# Patient Record
Sex: Female | Born: 1995 | Race: Black or African American | Hispanic: No | Marital: Single | Smoking: Never smoker
Health system: Southern US, Community
[De-identification: ages and names within clinical notes are randomized; demographics above are authoritative.]

## PROBLEM LIST (undated history)

## (undated) DIAGNOSIS — R569 Unspecified convulsions: Secondary | ICD-10-CM

## (undated) HISTORY — PX: ADENOIDECTOMY AND MYRINGOTOMY WITH TUBE PLACEMENT: SHX5714

## (undated) HISTORY — DX: Unspecified convulsions: R56.9

## (undated) HISTORY — PX: TYMPANOSTOMY TUBE PLACEMENT: SHX32

---
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
## 2017-04-19 ENCOUNTER — Ambulatory Visit: Payer: Self-pay | Admitting: Family Medicine

## 2017-04-21 ENCOUNTER — Ambulatory Visit: Payer: BLUE CROSS/BLUE SHIELD | Admitting: Family Medicine

## 2017-04-21 ENCOUNTER — Encounter: Payer: Self-pay | Admitting: Family Medicine

## 2017-04-21 VITALS — BP 100/60 | HR 87 | Temp 97.1°F | Ht 64.75 in | Wt 115.1 lb

## 2017-04-21 DIAGNOSIS — L2082 Flexural eczema: Secondary | ICD-10-CM

## 2017-04-21 DIAGNOSIS — Z8669 Personal history of other diseases of the nervous system and sense organs: Secondary | ICD-10-CM

## 2017-04-21 DIAGNOSIS — Z0000 Encounter for general adult medical examination without abnormal findings: Secondary | ICD-10-CM | POA: Diagnosis not present

## 2017-04-21 NOTE — Patient Instructions (Addendum)
Preventive Care 18-39 Years, Female Preventive care refers to lifestyle choices and visits with your health care provider that can promote health and wellness. What does preventive care include?  A yearly physical exam. This is also called an annual well check.  Dental exams once or twice a year.  Routine eye exams. Ask your health care provider how often you should have your eyes checked.  Personal lifestyle choices, including: ? Daily care of your teeth and gums. ? Regular physical activity. ? Eating a healthy diet. ? Avoiding tobacco and drug use. ? Limiting alcohol use. ? Practicing safe sex. ? Taking vitamin and mineral supplements as recommended by your health care provider. What happens during an annual well check? The services and screenings done by your health care provider during your annual well check will depend on your age, overall health, lifestyle risk factors, and family history of disease. Counseling Your health care provider may ask you questions about your:  Alcohol use.  Tobacco use.  Drug use.  Emotional well-being.  Home and relationship well-being.  Sexual activity.  Eating habits.  Work and work Statistician.  Method of birth control.  Menstrual cycle.  Pregnancy history.  Screening You may have the following tests or measurements:  Height, weight, and BMI.  Diabetes screening. This is done by checking your blood sugar (glucose) after you have not eaten for a while (fasting).  Blood pressure.  Lipid and cholesterol levels. These may be checked every 5 years starting at age 20.  Skin check.  Hepatitis C blood test.  Hepatitis B blood test.  Sexually transmitted disease (STD) testing.  BRCA-related cancer screening. This may be done if you have a family history of breast, ovarian, tubal, or peritoneal cancers.  Pelvic exam and Pap test. This may be done every 3 years starting at age 20. Starting at age 20, this may be done every 5  years if you have a Pap test in combination with an HPV test.  Discuss your test results, treatment options, and if necessary, the need for more tests with your health care provider. Vaccines Your health care provider may recommend certain vaccines, such as:  Influenza vaccine. This is recommended every year.  Tetanus, diphtheria, and acellular pertussis (Tdap, Td) vaccine. You may need a Td booster every 10 years.  Varicella vaccine. You may need this if you have not been vaccinated.  HPV vaccine. If you are 20 or younger, you may need three doses over 6 months.  Measles, mumps, and rubella (MMR) vaccine. You may need at least one dose of MMR. You may also need a second dose.  Pneumococcal 13-valent conjugate (PCV13) vaccine. You may need this if you have certain conditions and were not previously vaccinated.  Pneumococcal polysaccharide (PPSV23) vaccine. You may need one or two doses if you smoke cigarettes or if you have certain conditions.  Meningococcal vaccine. One dose is recommended if you are age 20-21 years and a first-year college student living in a residence hall, or if you have one of several medical conditions. You may also need additional booster doses.  Hepatitis A vaccine. You may need this if you have certain conditions or if you travel or work in places where you may be exposed to hepatitis A.  Hepatitis B vaccine. You may need this if you have certain conditions or if you travel or work in places where you may be exposed to hepatitis B.  Haemophilus influenzae type b (Hib) vaccine. You may need this if  you have certain risk factors.  Talk to your health care provider about which screenings and vaccines you need and how often you need them. This information is not intended to replace advice given to you by your health care provider. Make sure you discuss any questions you have with your health care provider. Document Released: 04/21/2001 Document Revised: 11/13/2015  Document Reviewed: 12/25/2014 Elsevier Interactive Patient Education  2018 [employer].  Eczema Eczema is a broad term for a group of skin conditions that cause skin to become rough and inflamed. Each type of eczema has different triggers, symptoms, and treatments. Eczema of any type is usually itchy and symptoms range from mild to severe. Eczema and its symptoms are not spread from person to person (are not contagious). It can appear on different parts of the body at different times. Your eczema may not look the same as someone else's eczema. What are the types of eczema? Atopic dermatitis This is a long-term (chronic) skin disease that keeps coming back (recurring). Usual symptoms are dry skin and small, solid pimples that may swell and leak fluid (weep). Contact dermatitis This happens when something irritates the skin and causes a rash. The irritation can come from substances that you are allergic to (allergens), such as poison ivy, chemicals, or medicines that were applied to your skin. Dyshidrotic eczema This is a form of eczema on the hands and feet. It shows up as very itchy, fluid-filled blisters. It can affect people of any age, but is more common before age 36. Hand eczema This causes very itchy areas of skin on the palms and sides of the hands and fingers. This type of eczema is common in industrial jobs where you may be exposed to many different types of irritants. Lichen simplex chronicus This type of eczema occurs when a person constantly scratches one area of the body. Repeated scratching of the area leads to thickened skin (lichenification). Lichen simplex chronicus can occur along with other types of eczema. It is more common in adults, but may be seen in children as well. Nummular eczema This is a common type of eczema. It has no known cause. It typically causes a red, circular, crusty lesion (plaque) that may be itchy. Scratching may become a habit and can cause bleeding.  Nummular eczema occurs most often in people of middle-age or older. It most often affects the hands. Seborrheic dermatitis This is a common skin disease that mainly affects the scalp. It may also affect any oily areas of the body, such as the face, sides of nose, eyebrows, ears, eyelids, and chest. It is marked by small scaling and redness of the skin (erythema). This can affect people of all ages. In infants, this condition is known as Chartered certified accountant." Stasis dermatitis This is a common skin disease that usually appears on the legs and feet. It most often occurs in people who have a condition that prevents blood from being pumped through the veins in the legs (chronic venous insufficiency). Stasis dermatitis is a chronic condition that needs long-term management. How is eczema diagnosed? Your health care provider will examine your skin and review your medical history. He or she may also give you skin patch tests. These tests involve taking patches that contain possible allergens and placing them on your back. He or she will then check in a few days to see if an allergic reaction occurred. What are the common treatments? Treatment for eczema is based on the type of eczema you have. Hydrocortisone  steroid medicine can relieve itching quickly and help reduce inflammation. This medicine may be prescribed or obtained over-the-counter, depending on the strength of the medicine that is needed. Follow these instructions at home:  Take over-the-counter and prescription medicines only as told by your health care provider.  Use creams or ointments to moisturize your skin. Do not use lotions.  Learn what triggers or irritates your symptoms. Avoid these things.  Treat symptom flare-ups quickly.  Do not itch your skin. This can make your rash worse.  Keep all follow-up visits as told by your health care provider. This is important. Where to find more information:  The American Academy of Dermatology:  http://jones-macias.info/  The National Eczema Association: www.nationaleczema.org Contact a health care provider if:  You have serious itching, even with treatment.  You regularly scratch your skin until it bleeds.  Your rash looks different than usual.  Your skin is painful, swollen, or more red than usual.  You have a fever. Summary  There are eight general types of eczema. Each type has different triggers.  Eczema of any type causes itching that may range from mild to severe.  Treatment varies based on the type of eczema you have. Hydrocortisone steroid medicine can help with itching and inflammation.  Protecting your skin is the best way to prevent eczema. Use moisturizers and lotions. Avoid triggers and irritants, and treat flare-ups quickly. This information is not intended to replace advice given to you by your health care provider. Make sure you discuss any questions you have with your health care provider. Document Released: 07/09/2016 Document Revised: 07/09/2016 Document Reviewed: 07/09/2016 Elsevier Interactive Patient Education  2018 [employer].

## 2017-04-21 NOTE — Progress Notes (Signed)
Patient presents to clinic today for CPE and to establish care.  SUBJECTIVE: PMH: Pt is a 22 yo female with pmh sig for eczema and a h/o seizures.  Pt was formerly seen at [facility] peds by Dr. Velvet Bathe.  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  She works as a Gaffer at [employer].  Patient denies tobacco, alcohol, drug use.  Patient is not currently sexually active.  LMP was April 14, 2017.  Family medical history: Mom-alive, DM, HTN, stroke Dad-alive, arthritis, HTN Sister-Charlene, alive Brother-Keon, alive, deaf MGM-deceased, DM, kidney disease MGF-alive, HTN PGM-deceased  Health Maintenance: Dental --Dr. Arlyss Queen   Past Medical History:  Diagnosis Date  . Seizures (HCC)     Past Surgical History:  Procedure Laterality Date  . ADENOIDECTOMY AND MYRINGOTOMY WITH TUBE PLACEMENT     22 Years old  . TYMPANOSTOMY TUBE PLACEMENT Bilateral    22 Years old    Current Outpatient Medications on File Prior to Visit  Medication Sig Dispense Refill  . Multiple Vitamin (MULTIVITAMINS PO) Take by mouth.     No current facility-administered medications on file prior to visit.     Allergies  Allergen Reactions  . Intuniv [Guanfacine Hcl]     Family History  Problem Relation Age of Onset  . Leukemia Paternal Grandmother   . Seizures Cousin        Childhood onset  . Epilepsy Other        Fhx of Epilepsy on  father's side, childhood onset    Social History   Socioeconomic History  . Marital status: Single    Spouse name: Not on file  . Number of children: Not on file  . Years of education: Not on file  . Highest education level: Not on file  Social Needs  . Financial resource strain: Not on file  . Food insecurity - worry: Not on file  . Food insecurity - inability: Not on file  . Transportation needs - medical: Not on file  . Transportation needs - non-medical: Not on file  Occupational History  . Not on file  Tobacco Use  . Smoking status: Never Smoker  . Smokeless tobacco: Never Used  Substance and Sexual Activity  . Alcohol use: No    Alcohol/week: 0.0 oz  . Drug use: No  . Sexual activity: No    Birth control/protection: None  Other Topics Concern  . Not on file  Social History Narrative  . Not on file    ROS General: Denies fever, chills, night sweats, changes in weight, changes in appetite HEENT: Denies headaches, ear pain, changes in vision, rhinorrhea, sore throat CV: Denies CP, palpitations, SOB, orthopnea Pulm: Denies SOB, cough, wheezing GI: Denies abdominal pain, nausea, vomiting, diarrhea, constipation GU: Denies dysuria, hematuria, frequency, vaginal discharge Msk: Denies muscle cramps, joint pains Neuro: Denies weakness, numbness, tingling   +h/o seizures Skin: Denies rashes, bruising  +eczema Psych: Denies depression, anxiety, hallucinations  BP 100/60 (BP Location: Right Arm, Patient Position: Sitting, Cuff Size: Normal)   Pulse 87   Temp (!) 97.1 F (36.2 C) (Oral)   Ht 5' 4.75" (1.645 m)   Wt 115 lb 1.6 oz (52.2 kg)   BMI 19.30 kg/m   Physical Exam Gen. Pleasant, well developed, well-nourished, in NAD HEENT - [place]/AT, PERRL, no scleral icterus, no nasal drainage, pharynx without erythema or exudate.  TMs normal bilaterally.  No cervical lymphadenopathy. Neck: No JVD, no thyromegaly, no carotid bruits Lungs: no use of accessory muscles, CTAB, no  wheezes, rales or rhonchi Cardiovascular: RRR, No r/g/m, no peripheral edema Abdomen: BS present, soft, nontender,nondistended, no hepatosplenomegaly Musculoskeletal: No deformities, moves all four extremities, no cyanosis or clubbing, normal tone Neuro:  A&Ox3, CN II-XII intact, normal gait Skin:  Warm, dry, intact, no lesions Psych: normal affect, mood appropriate  No results found for this or any previous visit (from the past 2160 hour(s)).  Assessment/Plan: Well adult exam -Anticipatory guidance given including wearing seatbelts, smoke detectors in the home, increasing physical activity, increasing p.o. intake of water, increasing p.o. intake of vegetables. -No labs needed at this time -Immunizations up-to-date. -Patient is also followed by OB/GYN.  Pap not indicated secondary to age  Flexural eczema -Discussed taking warm showers as opposed to hot showers, discussed using moisturizers on skin. -Encourage patient to avoid heavily scented lotions and soaps as they may be drying and irritating to her skin. -Given handout on eczema -Discussed using a different potency steroid cream if needed  H/O partial seizures -Currently stable -Not currently on medication -We will continue to monitor  Follow-up PRN  Abbe AmsterdamShannon Kimble Delaurentis, MD

## 2019-03-02 ENCOUNTER — Encounter (HOSPITAL_COMMUNITY): Payer: Self-pay

## 2019-03-02 DIAGNOSIS — Z3490 Encounter for supervision of normal pregnancy, unspecified, unspecified trimester: Secondary | ICD-10-CM | POA: Diagnosis not present

## 2019-03-02 DIAGNOSIS — Z3A18 18 weeks gestation of pregnancy: Secondary | ICD-10-CM | POA: Diagnosis not present

## 2019-03-02 DIAGNOSIS — O3680X9 Pregnancy with inconclusive fetal viability, other fetus: Secondary | ICD-10-CM | POA: Diagnosis not present

## 2019-03-02 LAB — OB RESULTS CONSOLE ANTIBODY SCREEN: Antibody Screen: POSITIVE

## 2019-03-02 LAB — OB RESULTS CONSOLE HIV ANTIBODY (ROUTINE TESTING): HIV: NONREACTIVE

## 2019-03-02 LAB — OB RESULTS CONSOLE GC/CHLAMYDIA
Chlamydia: NEGATIVE
Gonorrhea: NEGATIVE

## 2019-03-02 LAB — OB RESULTS CONSOLE HEPATITIS B SURFACE ANTIGEN: Hepatitis B Surface Ag: NEGATIVE

## 2019-03-02 LAB — OB RESULTS CONSOLE ABO/RH: RH Type: POSITIVE

## 2019-03-02 LAB — OB RESULTS CONSOLE RUBELLA ANTIBODY, IGM: Rubella: IMMUNE

## 2019-03-02 LAB — OB RESULTS CONSOLE RPR: RPR: NONREACTIVE

## 2019-03-06 ENCOUNTER — Other Ambulatory Visit (HOSPITAL_COMMUNITY): Payer: Self-pay | Admitting: Obstetrics and Gynecology

## 2019-03-06 DIAGNOSIS — Z3A21 21 weeks gestation of pregnancy: Secondary | ICD-10-CM

## 2019-03-06 DIAGNOSIS — Z363 Encounter for antenatal screening for malformations: Secondary | ICD-10-CM

## 2019-03-10 NOTE — L&D Delivery Note (Signed)
Delivery Note   Patient Name: Victoria Cowan DOB: 1996-01-17 MRN: [id number]  Date of admission: 07/14/2019 Delivering MD: Dale [place]  Date of delivery: 07/14/19 Type of delivery: SVD  Newborn Data: Live born female  Birth Weight:   APGAR: 9, 9  Newborn Delivery   Birth date/time: 07/14/2019 20:29:00 Delivery type: Vaginal, Spontaneous    Victoria Cowan, 23 y.o., @ [redacted]w[redacted]d,  G1P0, who was admitted for PROM. I was called to the room when she progressed 2+ station in the second stage of labor.  She pushed for 20/min.  She delivered a viable infant, cephalic and restituted to the ROA position over an intact perineum.  A nuchal cord   was not identified. The baby was placed on maternal abdomen while initial step of NRP were perfmored (Dry, Stimulated, and warmed). Hat placed on baby for thermoregulation. Delayed cord clamping was performed for 2 minutes.  Cord double clamped and cut.  Cord cut by mother. Apgar scores were 9 and 9. Prophylactic Pitocin was started in the third stage of labor for active management. The placenta delivered spontaneously with trailing membranes, shultz, with a 3 vessel cord and was sent to LD.  Inspection revealed none. An examination of the vaginal vault and cervix was free from lacerations. The uterus was firm, but frank blood noted, pt denies urinating in the last couple hours, Red robin performed and relieved of urine, then bleeding stable. Placenta and umbilical artery blood gas were not sent.  There were no complications during the procedure.  Mom and baby skin to skin following delivery. Left in stable condition.  Maternal Info: Anesthesia: None Episiotomy: NO Lacerations:  NOne Suture Repair: None Est. Blood Loss (mL):   Newborn Info:  Baby Sex: female  APGAR (1 MIN): 9   APGAR (5 MINS): 9   APGAR (10 MINS):     Mom to postpartum.  Baby to Couplet care / Skin to Skin.  Dr Richardson Dopp aware of birth.   [place], [place],  NP-C 07/14/19 9:03 PM

## 2019-03-23 ENCOUNTER — Other Ambulatory Visit: Payer: Self-pay

## 2019-03-23 ENCOUNTER — Ambulatory Visit (HOSPITAL_COMMUNITY)
Admission: RE | Admit: 2019-03-23 | Discharge: 2019-03-23 | Disposition: A | Discharge status: Home / Self Care | Payer: Medicaid Other | Source: Ambulatory Visit | Attending: Obstetrics and Gynecology | Admitting: Obstetrics and Gynecology

## 2019-03-23 DIAGNOSIS — Z363 Encounter for antenatal screening for malformations: Secondary | ICD-10-CM

## 2019-03-23 DIAGNOSIS — Z3A21 21 weeks gestation of pregnancy: Secondary | ICD-10-CM | POA: Insufficient documentation

## 2019-04-07 ENCOUNTER — Other Ambulatory Visit (HOSPITAL_COMMUNITY): Payer: Self-pay | Admitting: Obstetrics

## 2019-04-07 ENCOUNTER — Ambulatory Visit (HOSPITAL_COMMUNITY)
Admission: RE | Admit: 2019-04-07 | Discharge: 2019-04-07 | Disposition: A | Discharge status: Home / Self Care | Payer: Medicaid Other | Source: Ambulatory Visit | Attending: Obstetrics | Admitting: Obstetrics

## 2019-04-07 ENCOUNTER — Other Ambulatory Visit: Payer: Self-pay

## 2019-04-07 DIAGNOSIS — O26872 Cervical shortening, second trimester: Secondary | ICD-10-CM | POA: Diagnosis not present

## 2019-04-07 DIAGNOSIS — O358XX0 Maternal care for other (suspected) fetal abnormality and damage, not applicable or unspecified: Secondary | ICD-10-CM | POA: Diagnosis not present

## 2019-04-07 DIAGNOSIS — Z3A23 23 weeks gestation of pregnancy: Secondary | ICD-10-CM

## 2019-04-07 DIAGNOSIS — O26879 Cervical shortening, unspecified trimester: Secondary | ICD-10-CM

## 2019-04-07 DIAGNOSIS — G40909 Epilepsy, unspecified, not intractable, without status epilepticus: Secondary | ICD-10-CM

## 2019-04-07 DIAGNOSIS — O99352 Diseases of the nervous system complicating pregnancy, second trimester: Secondary | ICD-10-CM

## 2019-05-04 DIAGNOSIS — Z3492 Encounter for supervision of normal pregnancy, unspecified, second trimester: Secondary | ICD-10-CM | POA: Diagnosis not present

## 2019-05-18 ENCOUNTER — Other Ambulatory Visit (HOSPITAL_COMMUNITY): Payer: Self-pay | Admitting: Obstetrics and Gynecology

## 2019-05-18 DIAGNOSIS — Z3A31 31 weeks gestation of pregnancy: Secondary | ICD-10-CM

## 2019-05-18 DIAGNOSIS — Z3689 Encounter for other specified antenatal screening: Secondary | ICD-10-CM

## 2019-05-24 ENCOUNTER — Encounter (HOSPITAL_COMMUNITY): Payer: Self-pay | Admitting: *Deleted

## 2019-05-29 ENCOUNTER — Encounter (HOSPITAL_COMMUNITY): Payer: Self-pay | Admitting: *Deleted

## 2019-05-29 ENCOUNTER — Ambulatory Visit (HOSPITAL_COMMUNITY): Payer: Medicaid Other | Admitting: *Deleted

## 2019-05-29 ENCOUNTER — Ambulatory Visit (HOSPITAL_COMMUNITY)
Admission: RE | Admit: 2019-05-29 | Discharge: 2019-05-29 | Disposition: A | Discharge status: Home / Self Care | Payer: Medicaid Other | Source: Ambulatory Visit | Attending: Obstetrics and Gynecology | Admitting: Obstetrics and Gynecology

## 2019-05-29 ENCOUNTER — Other Ambulatory Visit: Payer: Self-pay

## 2019-05-29 VITALS — BP 128/70 | HR 98 | Temp 97.8°F

## 2019-05-29 DIAGNOSIS — O358XX0 Maternal care for other (suspected) fetal abnormality and damage, not applicable or unspecified: Secondary | ICD-10-CM | POA: Diagnosis not present

## 2019-05-29 DIAGNOSIS — O99353 Diseases of the nervous system complicating pregnancy, third trimester: Secondary | ICD-10-CM | POA: Diagnosis not present

## 2019-05-29 DIAGNOSIS — Z3689 Encounter for other specified antenatal screening: Secondary | ICD-10-CM | POA: Diagnosis not present

## 2019-05-29 DIAGNOSIS — Z3686 Encounter for antenatal screening for cervical length: Secondary | ICD-10-CM | POA: Insufficient documentation

## 2019-05-29 DIAGNOSIS — O26843 Uterine size-date discrepancy, third trimester: Secondary | ICD-10-CM | POA: Diagnosis not present

## 2019-05-29 DIAGNOSIS — G40909 Epilepsy, unspecified, not intractable, without status epilepticus: Secondary | ICD-10-CM | POA: Insufficient documentation

## 2019-05-29 DIAGNOSIS — Z3A31 31 weeks gestation of pregnancy: Secondary | ICD-10-CM | POA: Diagnosis not present

## 2019-05-29 DIAGNOSIS — O283 Abnormal ultrasonic finding on antenatal screening of mother: Secondary | ICD-10-CM

## 2019-06-29 DIAGNOSIS — H52223 Regular astigmatism, bilateral: Secondary | ICD-10-CM | POA: Diagnosis not present

## 2019-06-29 DIAGNOSIS — H5213 Myopia, bilateral: Secondary | ICD-10-CM | POA: Diagnosis not present

## 2019-07-06 DIAGNOSIS — Z3A36 36 weeks gestation of pregnancy: Secondary | ICD-10-CM | POA: Diagnosis not present

## 2019-07-06 DIAGNOSIS — Z113 Encounter for screening for infections with a predominantly sexual mode of transmission: Secondary | ICD-10-CM | POA: Diagnosis not present

## 2019-07-06 LAB — OB RESULTS CONSOLE GBS: GBS: NEGATIVE

## 2019-07-14 ENCOUNTER — Other Ambulatory Visit: Payer: Self-pay

## 2019-07-14 ENCOUNTER — Inpatient Hospital Stay (HOSPITAL_COMMUNITY)
Admission: AD | Admit: 2019-07-14 | Discharge: 2019-07-16 | DRG: 806 | Disposition: A | Discharge status: Home / Self Care | Payer: Medicaid Other | Attending: Obstetrics and Gynecology | Admitting: Obstetrics and Gynecology

## 2019-07-14 ENCOUNTER — Encounter (HOSPITAL_COMMUNITY): Payer: Self-pay | Admitting: Obstetrics and Gynecology

## 2019-07-14 DIAGNOSIS — O99354 Diseases of the nervous system complicating childbirth: Secondary | ICD-10-CM | POA: Diagnosis present

## 2019-07-14 DIAGNOSIS — G40909 Epilepsy, unspecified, not intractable, without status epilepticus: Secondary | ICD-10-CM | POA: Diagnosis present

## 2019-07-14 DIAGNOSIS — Z3A37 37 weeks gestation of pregnancy: Secondary | ICD-10-CM | POA: Diagnosis not present

## 2019-07-14 DIAGNOSIS — Z20822 Contact with and (suspected) exposure to covid-19: Secondary | ICD-10-CM | POA: Diagnosis present

## 2019-07-14 DIAGNOSIS — O42013 Preterm premature rupture of membranes, onset of labor within 24 hours of rupture, third trimester: Secondary | ICD-10-CM | POA: Diagnosis not present

## 2019-07-14 DIAGNOSIS — Z3493 Encounter for supervision of normal pregnancy, unspecified, third trimester: Secondary | ICD-10-CM | POA: Diagnosis not present

## 2019-07-14 DIAGNOSIS — O4292 Full-term premature rupture of membranes, unspecified as to length of time between rupture and onset of labor: Principal | ICD-10-CM | POA: Diagnosis present

## 2019-07-14 DIAGNOSIS — O26893 Other specified pregnancy related conditions, third trimester: Secondary | ICD-10-CM | POA: Diagnosis present

## 2019-07-14 DIAGNOSIS — O4290 Premature rupture of membranes, unspecified as to length of time between rupture and onset of labor, unspecified weeks of gestation: Secondary | ICD-10-CM | POA: Diagnosis not present

## 2019-07-14 LAB — SARS CORONAVIRUS 2 (TAT 6-24 HRS): SARS Coronavirus 2: NEGATIVE

## 2019-07-14 LAB — CBC
HCT: 43.1 % (ref 36.0–46.0)
Hemoglobin: 14.1 g/dL (ref 12.0–15.0)
MCH: 30.9 pg (ref 26.0–34.0)
MCHC: 32.7 g/dL (ref 30.0–36.0)
MCV: 94.5 fL (ref 80.0–100.0)
Platelets: 258 10*3/uL (ref 150–400)
RBC: 4.56 MIL/uL (ref 3.87–5.11)
RDW: 12.2 % (ref 11.5–15.5)
WBC: 6.9 10*3/uL (ref 4.0–10.5)
nRBC: 0 % (ref 0.0–0.2)

## 2019-07-14 MED ORDER — LACTATED RINGERS IV SOLN: 500.0000 mL | INTRAVENOUS | Status: DC | PRN

## 2019-07-14 MED ORDER — EPHEDRINE 5 MG/ML INJ: 10.0000 mg | INTRAVENOUS | Status: DC | PRN

## 2019-07-14 MED ORDER — SENNOSIDES-DOCUSATE SODIUM 8.6-50 MG PO TABS
2.0000 | ORAL_TABLET | ORAL | Status: DC
  Administered 2019-07-14 – 2019-07-15 (×2): 2 via ORAL
  Filled 2019-07-14 (×2): qty 2

## 2019-07-14 MED ORDER — ONDANSETRON HCL 4 MG/2ML IJ SOLN: 4.0000 mg | Freq: Four times a day (QID) | INTRAMUSCULAR | Status: DC | PRN

## 2019-07-14 MED ORDER — PHENYLEPHRINE 40 MCG/ML (10ML) SYRINGE FOR IV PUSH (FOR BLOOD PRESSURE SUPPORT): 80.0000 ug | PREFILLED_SYRINGE | INTRAVENOUS | Status: DC | PRN

## 2019-07-14 MED ORDER — COCONUT OIL OIL
1.0000 "application " | TOPICAL_OIL | Status: DC | PRN
  Administered 2019-07-15: 1 via TOPICAL

## 2019-07-14 MED ORDER — SOD CITRATE-CITRIC ACID 500-334 MG/5ML PO SOLN: 30.0000 mL | ORAL | Status: DC | PRN

## 2019-07-14 MED ORDER — METHYLERGONOVINE MALEATE 0.2 MG PO TABS
0.2000 mg | ORAL_TABLET | Freq: Four times a day (QID) | ORAL | Status: AC
  Administered 2019-07-15 (×4): 0.2 mg via ORAL
  Filled 2019-07-14 (×4): qty 1

## 2019-07-14 MED ORDER — WITCH HAZEL-GLYCERIN EX PADS: 1.0000 "application " | MEDICATED_PAD | CUTANEOUS | Status: DC | PRN

## 2019-07-14 MED ORDER — DIPHENHYDRAMINE HCL 50 MG/ML IJ SOLN: 12.5000 mg | INTRAMUSCULAR | Status: DC | PRN

## 2019-07-14 MED ORDER — LACTATED RINGERS IV SOLN: 500.0000 mL | Freq: Once | INTRAVENOUS | Status: DC

## 2019-07-14 MED ORDER — ONDANSETRON HCL 4 MG/2ML IJ SOLN: 4.0000 mg | INTRAMUSCULAR | Status: DC | PRN

## 2019-07-14 MED ORDER — METHYLERGONOVINE MALEATE 0.2 MG/ML IJ SOLN: 0.2000 mg | Freq: Once | INTRAMUSCULAR | Status: AC

## 2019-07-14 MED ORDER — DIPHENHYDRAMINE HCL 25 MG PO CAPS: 25.0000 mg | ORAL_CAPSULE | Freq: Four times a day (QID) | ORAL | Status: DC | PRN

## 2019-07-14 MED ORDER — TRANEXAMIC ACID-NACL 1000-0.7 MG/100ML-% IV SOLN
INTRAVENOUS | Status: AC
  Administered 2019-07-14: 1000 mg via INTRAVENOUS
  Filled 2019-07-14: qty 100

## 2019-07-14 MED ORDER — FENTANYL CITRATE (PF) 100 MCG/2ML IJ SOLN
50.0000 ug | INTRAMUSCULAR | Status: DC | PRN
  Administered 2019-07-14: 19:00:00 100 ug via INTRAVENOUS
  Filled 2019-07-14: qty 2

## 2019-07-14 MED ORDER — ONDANSETRON HCL 4 MG PO TABS: 4.0000 mg | ORAL_TABLET | ORAL | Status: DC | PRN

## 2019-07-14 MED ORDER — TERBUTALINE SULFATE 1 MG/ML IJ SOLN: 0.2500 mg | Freq: Once | INTRAMUSCULAR | Status: DC | PRN

## 2019-07-14 MED ORDER — SIMETHICONE 80 MG PO CHEW: 80.0000 mg | CHEWABLE_TABLET | ORAL | Status: DC | PRN

## 2019-07-14 MED ORDER — FENTANYL-BUPIVACAINE-NACL 0.5-0.125-0.9 MG/250ML-% EP SOLN: 12.0000 mL/h | EPIDURAL | Status: DC | PRN

## 2019-07-14 MED ORDER — TRANEXAMIC ACID-NACL 1000-0.7 MG/100ML-% IV SOLN: 1000.0000 mg | Freq: Once | INTRAVENOUS | Status: AC

## 2019-07-14 MED ORDER — ACETAMINOPHEN 500 MG PO TABS: 1000.0000 mg | ORAL_TABLET | Freq: Four times a day (QID) | ORAL | Status: DC | PRN

## 2019-07-14 MED ORDER — OXYTOCIN 10 UNIT/ML IJ SOLN: 10.0000 [IU] | Freq: Once | INTRAMUSCULAR | Status: DC

## 2019-07-14 MED ORDER — BENZOCAINE-MENTHOL 20-0.5 % EX AERO: 1.0000 "application " | INHALATION_SPRAY | CUTANEOUS | Status: DC | PRN

## 2019-07-14 MED ORDER — LIDOCAINE HCL (PF) 1 % IJ SOLN: 30.0000 mL | INTRAMUSCULAR | Status: DC | PRN

## 2019-07-14 MED ORDER — DIBUCAINE (PERIANAL) 1 % EX OINT: 1.0000 "application " | TOPICAL_OINTMENT | CUTANEOUS | Status: DC | PRN

## 2019-07-14 MED ORDER — OXYTOCIN 40 UNITS IN NORMAL SALINE INFUSION - SIMPLE MED
1.0000 m[IU]/min | INTRAVENOUS | Status: DC
  Administered 2019-07-14: 2 m[IU]/min via INTRAVENOUS

## 2019-07-14 MED ORDER — IBUPROFEN 600 MG PO TABS
600.0000 mg | ORAL_TABLET | Freq: Four times a day (QID) | ORAL | Status: DC
  Administered 2019-07-14 – 2019-07-15 (×2): 600 mg via ORAL
  Administered 2019-07-15 (×2): 300 mg via ORAL
  Administered 2019-07-15: 600 mg via ORAL
  Administered 2019-07-16: 300 mg via ORAL
  Administered 2019-07-16: 600 mg via ORAL
  Filled 2019-07-14 (×7): qty 1

## 2019-07-14 MED ORDER — OXYTOCIN BOLUS FROM INFUSION
500.0000 mL | Freq: Once | INTRAVENOUS | Status: AC
  Administered 2019-07-14: 21:00:00 500 mL via INTRAVENOUS

## 2019-07-14 MED ORDER — ZOLPIDEM TARTRATE 5 MG PO TABS: 5.0000 mg | ORAL_TABLET | Freq: Every evening | ORAL | Status: DC | PRN

## 2019-07-14 MED ORDER — LACTATED RINGERS IV SOLN: INTRAVENOUS | Status: DC

## 2019-07-14 MED ORDER — PRENATAL MULTIVITAMIN CH
1.0000 | ORAL_TABLET | Freq: Every day | ORAL | Status: DC
  Administered 2019-07-15 – 2019-07-16 (×2): 1 via ORAL
  Filled 2019-07-14 (×2): qty 1

## 2019-07-14 MED ORDER — TETANUS-DIPHTH-ACELL PERTUSSIS 5-2.5-18.5 LF-MCG/0.5 IM SUSP
0.5000 mL | Freq: Once | INTRAMUSCULAR | Status: AC
  Administered 2019-07-16: 14:00:00 0.5 mL via INTRAMUSCULAR
  Filled 2019-07-14: qty 0.5

## 2019-07-14 MED ORDER — OXYTOCIN 40 UNITS IN NORMAL SALINE INFUSION - SIMPLE MED
2.5000 [IU]/h | INTRAVENOUS | Status: DC
  Filled 2019-07-14: qty 1000

## 2019-07-14 MED ORDER — ACETAMINOPHEN 325 MG PO TABS
650.0000 mg | ORAL_TABLET | ORAL | Status: DC | PRN
  Filled 2019-07-14: qty 2

## 2019-07-14 MED ORDER — METHYLERGONOVINE MALEATE 0.2 MG/ML IJ SOLN
INTRAMUSCULAR | Status: AC
  Administered 2019-07-14: 0.2 mg via INTRAMUSCULAR
  Filled 2019-07-14: qty 1

## 2019-07-14 NOTE — H&P (Signed)
OB ADMISSION/ HISTORY & PHYSICAL:  Admission Date: 07/14/2019 11:58 AM  Admit Diagnosis: Normal labor [O80, Z37.9]    Victoria Cowan is a 24 y.o. female presenting for spontaneous rupture of membranes without labor. Pt was at work this morning and had a small amount of clear fluid leak from her vagina. She was seen in the office by Dr. Mora Appl and was fern+. Was sent to the hospital for direct admission. Denies vaginal bleeding or contractions. Endorses + fetal movement. Mother at the bedside. Eagerly anticipating the arrival of baby girl, Victoria Cowan.   Prenatal History: G1P0   EDC : 07/30/2019, by Ultrasound  Prenatal care at [facility] since 17 weeks   Prenatal course complicated by: 1. Seizure disorder - partial seizures as a teen , no current medications 2. Short cervical length in pregnancy - 2.67 at 20 weeks, stable since 3. Eczema, mild - no medications 4. Positive antibody screen - anti-M, MFM aware and no need for follow-up  Prenatal Labs: ABO, Rh: O (12/24 0000) O POS Antibody: PENDING (05/07 1331) Positive, anti-M Rubella: Immune (12/24 0000) Immune RPR: Nonreactive (12/24 0000) Non-reactive HBsAg: Negative (12/24 0000) Negative HIV: Non-reactive (12/24 0000) Negative GBS: Negative/-- (04/29 0000) Negative 1 hr Glucola : 106 Genetic Screening: declined Ultrasound Growth 05/29/19 at 31.1 weeks - 1773gm, 3lb15oz, 49%    Maternal Diabetes: No Genetic Screening: Declined Maternal Ultrasounds/Referrals: Normal Fetal Ultrasounds or other Referrals:  None Maternal Substance Abuse:  No Significant Maternal Medications:  None Significant Maternal Lab Results:  None Other Comments:  None  Medical / Surgical History : Past medical history:  Past Medical History:  Diagnosis Date  . Seizures (HCC)    2014 last seizures    Past surgical history:  Past Surgical History:  Procedure Laterality Date  . ADENOIDECTOMY AND MYRINGOTOMY WITH TUBE PLACEMENT     24 Years old  . TYMPANOSTOMY  TUBE PLACEMENT Bilateral    24 Years old    Family History:  Family History  Problem Relation Age of Onset  . Leukemia Paternal Grandmother   . Seizures Cousin        Childhood onset  . Epilepsy Other        Fhx of Epilepsy on father's side, childhood onset    Social History:  reports that she has never smoked. She has never used smokeless tobacco. She reports that she does not drink alcohol or use drugs.  Allergies: Grapefruit extract and Guanfacine hcl   Current Medications at time of admission:  Medications Prior to Admission  Medication Sig Dispense Refill Last Dose  . acetaminophen (TYLENOL) 325 MG tablet Take 650 mg by mouth every 6 (six) hours as needed.   07/09/2019 at Unknown time  . Prenatal Vit-Fe Fumarate-FA (MULTIVITAMIN-PRENATAL) 27-0.8 MG TABS tablet Take 1 tablet by mouth daily at 12 noon.   07/14/2019 at Unknown time    Review of Systems: Review of Systems  Constitutional: Negative for chills and fever.  HENT: Negative for congestion and sore throat.   Eyes: Negative for blurred vision and double vision.  Respiratory: Negative for cough and shortness of breath.   Cardiovascular: Negative for chest pain and leg swelling.  Gastrointestinal: Positive for heartburn. Negative for nausea and vomiting.  Genitourinary: Negative for dysuria, frequency and urgency.  Musculoskeletal: Negative for back pain and joint pain.  Neurological: Negative for weakness and headaches.  Psychiatric/Behavioral: Negative for depression. The patient is not nervous/anxious.    Physical Exam: Vital signs and nursing notes reviewed.  Vitals:  07/14/19 1239 07/14/19 1402  BP: 140/87 122/67  Pulse: (!) 101 83  Resp: 18 18  Temp:  98.2 F (36.8 C)   General: AAO x 3, NAD Heart: RRR Lungs:CTAB Abdomen: Gravid, NT, Leopold's 6-7 lbs Extremities: no edema Genitalia / VE: Dilation: 2.5 Effacement (%): 80 Station: -1 Presentation: Vertex Exam by:: Gavin Potters CNM   FHR: 140BPM, mod  variability, + accels, no decels TOCO: Ctx irregular  Labs:   Pending T&S, CBC, RPR  Recent Labs    07/14/19 1246  WBC 6.9  HGB 14.1  HCT 43.1  PLT 258   Assessment:  24 y.o. G1P0 at [redacted]w[redacted]d admitted for SROM and early labor. SVE 3/80 with mild, irregular contractions. GBS negative.   1. FHR category 1 2. GBS negative 3. Plans medication free delivery 4. Plans to breastfeed  Plan:  1. Admit to [facility] 2. Routine L&D orders 3. Analgesia/anesthesia PRN  4. Start Pitocin 2x2 until adequate contractions 5. Anticipate labor progression and vaginal delivery.   Dr. Mancel Bale notified of admission/plan of care.  Suzan Nailer CNM, MSN 07/14/2019, 2:47 PM

## 2019-07-14 NOTE — Progress Notes (Signed)
Labor Progress Note  Subjective: Victoria Cowan, 24 y.o., G1P0, with an IUP @ [redacted]w[redacted]d, presented for PROM. Pt is in bed, crying with contractions. Mother is at the bedside providing support.   Patient Active Problem List   Diagnosis Date Noted  . Normal labor 07/14/2019  . Abnormal involuntary movement 08/16/2014  . Dysfunctions of sleep stages or arousal from sleep 08/16/2014  . Convulsion (HCC) 08/07/2014  . Localization-related symptomatic epilepsy and epileptic syndromes with complex partial seizures, not intractable, without status epilepticus (HCC) 08/07/2014  . Encounter for long-term (current) use of other high-risk medications 08/07/2014  . Disturbance, sleep 08/07/2014   Objective: BP 122/69   Pulse 83   Temp 98.1 F (36.7 C) (Oral)   Resp 18   Ht 5\' 5"  (1.651 m)   Wt 65.6 kg   LMP 11/02/2018 (Approximate)   BMI 24.08 kg/m  No intake/output data recorded. No intake/output data recorded. NST: FHR baseline 150 bpm, Variability: moderate, Accelerations:present, Decelerations:  Absent= Cat 1/Reactive CTX:  regular, every 2-2.5 minutes Uterus gravid, soft non tender, moderate to palpate with contractions.  SVE:  Dilation: 4 Effacement (%): 90 Station: -1 Exam by:: [email or web address] CNM Pitocin at 10 mUn/min  Assessment:  IOL d/t PROM NICHD: Category 1 Membranes:  PROM x 10 hours, no s/s of infection, fore bag ruptured at 1830, clear fluid Induction:    Pitocin - 2x2, currently on 71mu Pain management:               IV pain management: x 1 GBS negative   Plan: Continue pitocin per protocol Frequent position changes to facilitate fetal rotation and descent. Will reassess with cervical exam at 2230 or earlier if necessary Anticipate labor progression and vaginal delivery.   Dr. 2231 aware of plan and verbalized agreement.   Richardson Dopp, CNM, MSN 07/14/2019. 6:39 PM

## 2019-07-14 NOTE — Progress Notes (Signed)
S: Called by RN was informed fundal rub yielded frank gush of blood totaling 135, total EBL now , due to trailing membranes at delivery of placenta , TXA and IM Methergine was given, uterus firm and hemostatic now, Dr Richardson Dopp updated and recommended 24 hours of po methergine, po methergine ordered to start at 03:30am on 5/8, CBC drawn for morning, starting hgb was -4.1!   Dale Banks, CNM, [place]

## 2019-07-14 NOTE — MAU Note (Signed)
Was at work, water broke a little 0855 clear fluid, still coming.  No bleeding. No pains. Went to office was told 3+ and they sent her over here.

## 2019-07-15 LAB — TYPE AND SCREEN
ABO/RH(D): O POS
Antibody Screen: POSITIVE
Donor AG Type: NEGATIVE
Donor AG Type: NEGATIVE
PT AG Type: NEGATIVE
Unit division: 0
Unit division: 0

## 2019-07-15 LAB — BPAM RBC
Blood Product Expiration Date: 202105172359
Blood Product Expiration Date: 202106092359
Unit Type and Rh: 5100
Unit Type and Rh: 5100

## 2019-07-15 LAB — CBC
HCT: 43.2 % (ref 36.0–46.0)
Hemoglobin: 14.3 g/dL (ref 12.0–15.0)
MCH: 31 pg (ref 26.0–34.0)
MCHC: 33.1 g/dL (ref 30.0–36.0)
MCV: 93.7 fL (ref 80.0–100.0)
Platelets: 283 10*3/uL (ref 150–400)
RBC: 4.61 MIL/uL (ref 3.87–5.11)
RDW: 12.2 % (ref 11.5–15.5)
WBC: 18.5 10*3/uL — ABNORMAL HIGH (ref 4.0–10.5)
nRBC: 0 % (ref 0.0–0.2)

## 2019-07-15 LAB — RPR: RPR Ser Ql: NONREACTIVE

## 2019-07-15 NOTE — Lactation Note (Addendum)
This note was copied from a baby's chart. Lactation Consultation Note  Patient Name: Victoria Cowan [id number] Date: 07/15/2019  Entered room and mom asleep.  Grandmother reports its time for her to wake up and feed baby anyway.  Baby Victoria Marten now 23 hours old. Woke mom up and offered to assist or observe breastfeeding.  Mom reports she really feels like she has breastfeeding and denies need for breastfeeding assistance at this time. Offered to hand her to mom.  Mom agreed.  Noticed infant had a poopy diaper.  Changed her first and handed to mom.  Upon changing diaper noticed infant somewhat purses lips when crying, does not open wide and questionable tight frenulum. Grandmother with questions regarding pumping.  Grandmother got a call and had to step out so discussed with mom what her plans and goals are surrounding infant feeding.   Mom reports she plans to do breastfeeding and bottle feeding with both breastmilk and formula.  Mom reports she has a pump for home use but not sure what it is.   Mom asked if I would show her how to use the DEBP and if she could start using it her.  Explained to mom that you usually don't get much milk or any in the beginning with pumping so not to get discouraged if she pumps and doesn't get anything.  Mom said she would like for me to come back after infant feeds and show her how to pump.  Let mom know just to let her nurse know and she will have me come back.  Urged to call lactation as needed.   Maternal Data    Feeding Feeding Type: Breast Fed  LATCH Score Latch: Repeated attempts needed to sustain latch, nipple held in mouth throughout feeding, stimulation needed to elicit sucking reflex.  Audible Swallowing: A few with stimulation  Type of Nipple: Everted at rest and after stimulation  Comfort (Breast/Nipple): Soft / non-tender  Hold (Positioning): Assistance needed to correctly position infant at breast and maintain latch.  LATCH Score:  7  Interventions    Lactation Tools Discussed/Used     Consult Status      Tenna Lacko Michaelle Copas 07/15/2019, 3:42 PM

## 2019-07-15 NOTE — Lactation Note (Addendum)
This note was copied from a baby's chart. Lactation Consultation Note  Patient Name: Victoria Cowan [id number] Date: 07/15/2019 Reason for consult: Follow-up assessment  Baby Victoria Lienhard now 34 hours old.  Telephone call from RN that mom needs help with breastfeeding. RN reports that mom may need a nipple shield.RN reports that the doctor said infant had a tight frenulum and not opening wide.   Assisted with breastfeeding.  Mom has long fingernails. Trying to get her hands in the right place, fingernails out of the way,  and get baby latched on is challanging. Asked mom if we could try a nipple shield. She said yes but once I got it on her and attempted to latch infant she said can we just try bottles.  I Said yes. Then grandmother and mom said they need to talk with Dr. Azucena Kuba to see which formula is better for her.   Initiated pumping with DEBP with mom.  After mom has been pumping a few minutes and not getting anything mom decided she wanted to try breastfeeding again sitting up in the chair.  Attempt to assist mom in breastfeeding in the chair.  Infant will latch but unable to maintain.  After a few attempts assist mom using t cup hold.  Infant able to latch and maintain for a few sucks  but mom does not continue to hold her breast..Urged mom to hold her breast many times.  As soon as infant gets there she lets go.   Unable to determine if infant able to breastfeed effectively or not. After a few more attempts.  Mom said they were good.  Urged her to start hand expressing, pumping and feeding back all expressed mothers milk. Urged mom to Think about formula or donor milk as a choice to feed baby supplement until she is breastfeeding better.  Urged to call lactation as needed.  Maternal Data Formula Feeding for Exclusion: No Has patient been taught Hand Expression?: Yes Does the patient have breastfeeding experience prior to this delivery?: No  Feeding Feeding Type: Breast Fed  LATCH Score Latch:  Too sleepy or reluctant, no latch achieved, no sucking elicited.(sucking tongue/lips)  Audible Swallowing: None  Type of Nipple: Everted at rest and after stimulation  Comfort (Breast/Nipple): Soft / non-tender  Hold (Positioning): Assistance needed to correctly position infant at breast and maintain latch.  LATCH Score: 5  Interventions Interventions: Breast feeding basics reviewed  Lactation Tools Discussed/Used     Consult Status Consult Status: Follow-up Date: 07/15/19 Follow-up type: In-patient    [facility] Michaelle Copas 07/15/2019, 5:58 PM

## 2019-07-15 NOTE — Progress Notes (Addendum)
Subjective: Postpartum Day # 1 : S/P NSVD due to PROM. Patient up ad lib, denies syncope or dizziness. Reports consuming regular diet without issues and denies N/V. Patient reports no bowel movement but is passing flatus.  Denies issues with urination and reports bleeding is "good now."  Patient is breastfeeding and reports going well.  Undecided for postpartum contraception.  Pain is being appropriately managed with use of Motrin.   No laceration Feeding:  Breast Contraceptive plan:  Undecided  Objective: Vital signs in last 24 hours: Vitals:   07/15/19 0450 07/15/19 0830  BP: 112/67 116/77  Pulse: 63 86  Resp: 16 16  Temp: 98.6 F (37 C) 97.7 F (36.5 C)  SpO2:     Physical Exam:  General: alert and cooperative Mood/Affect: appropriate, excited about new baby, Victoria Cowan Lungs: clear to auscultation, no wheezes, rales or rhonchi, symmetric air entry.  Heart: normal rate, regular rhythm, normal S1, S2, no murmurs, rubs, clicks or gallops. Breast: breasts appear normal, no suspicious masses, no skin or nipple changes or axillary nodes. Abdomen:  + bowel sounds, soft, non-tender GU: perineum intact, healing well. No signs of external hematomas.  Uterine Fundus: firm Lochia: appropriate Skin: Warm, Dry. DVT Evaluation: No evidence of DVT seen on physical exam.  CBC Latest Ref Rng & Units 07/15/2019 07/14/2019 12/22/2006  WBC 4.0 - 10.5 K/uL 18.5(H) 6.9 5.9  Hemoglobin 12.0 - 15.0 g/dL 66.4 40.3 47.4  Hematocrit 36.0 - 46.0 % 43.2 43.1 39.6  Platelets 150 - 400 K/uL 283 258 422(H)    Results for orders placed or performed during the hospital encounter of 07/14/19 (from the past 24 hour(s))  CBC     Status: None   Collection Time: 07/14/19 12:46 PM  Result Value Ref Range   WBC 6.9 4.0 - 10.5 K/uL   RBC 4.56 3.87 - 5.11 MIL/uL   Hemoglobin 14.1 12.0 - 15.0 g/dL   HCT 25.9 56.3 - 87.5 %   MCV 94.5 80.0 - 100.0 fL   MCH 30.9 26.0 - 34.0 pg   MCHC 32.7 30.0 - 36.0 g/dL   RDW 64.3  32.9 - 51.8 %   Platelets 258 150 - 400 K/uL   nRBC 0.0 0.0 - 0.2 %  Type and screen [facility]     Status: None   Collection Time: 07/14/19  1:31 PM  Result Value Ref Range   ABO/RH(D) O POS    Antibody Screen POS    Sample Expiration 07/17/2019,2359    Antibody Identification ANTI M    PT AG Type NEGATIVE FOR M ANTIGEN    Unit Number [id number]    Blood Component Type RED CELLS,LR    Unit division 00    Status of Unit REL FROM [facility]    Transfusion Status OK TO TRANSFUSE    Crossmatch Result COMPATIBLE    Donor AG Type      NEGATIVE FOR M ANTIGEN Performed at [facility], [address], [place], [place] [address]    Unit Number [id number]    Blood Component Type RED CELLS,LR    Unit division 00    Status of Unit REL FROM [facility]    Transfusion Status OK TO TRANSFUSE    Crossmatch Result COMPATIBLE    Donor AG Type NEGATIVE FOR M ANTIGEN   RPR     Status: None   Collection Time: 07/14/19  1:44 PM  Result Value Ref Range   RPR Ser Ql NON REACTIVE NON REACTIVE  CBC     Status: Abnormal   Collection Time: 07/15/19  5:12 AM  Result Value Ref Range   WBC 18.5 (H) 4.0 - 10.5 K/uL   RBC 4.61 3.87 - 5.11 MIL/uL   Hemoglobin 14.3 12.0 - 15.0 g/dL   HCT 43.2 36.0 - 46.0 %   MCV 93.7 80.0 - 100.0 fL   MCH 31.0 26.0 - 34.0 pg   MCHC 33.1 30.0 - 36.0 g/dL   RDW 12.2 11.5 - 15.5 %   Platelets 283 150 - 400 K/uL   nRBC 0.0 0.0 - 0.2 %     CBG (last 3)  No results for input(s): GLUCAP in the last 72 hours.   I/O last 3 completed shifts: In: -  Out: 799 [Urine:150; Blood:649]   Assessment Postpartum Day # 1 : S/P NSVD due to active labor. Pt stable. U-1 involution. Breastfeeding. Hemodynamically stable.   Plan: Continue other mgmt as ordered VTE prophylactics: Early ambulated as tolerates.  Pain control: Motrin/Tylenol PRN Plan for discharge tomorrow.   Dr. Landry Mellow to be updated on patient status  Zettie Pho, MSN 07/15/2019, 12:37  PM

## 2019-07-15 NOTE — Lactation Note (Signed)
This note was copied from a baby's chart. Lactation Consultation Note Baby 8 hrs old. Mom stated baby is BF well. Denies painful latch. Mom has long tubular breast w/very compressible short shaft nipples. In football position assisted in latch. Baby is off and on frequently. Mouth needs adjusting frequently. Clicking sound, chin tug or breast compression helpful.  Mom has really long artifical nails making it difficult to maneuver breast.  Encouraged to touch top lip w/nipple then latch baby when baby opens wide.  Noted baby's nose pulling down w/top lip when sucking at times. Flanged upper lip. Baby has thick labial frenulum.  Newborn behavior, STS, I&O, feeding habits, positioning, support, safety, supply and demand discussed. Mom encouraged to feed baby 8-12 times/24 hours and with feeding cues.   Encouraged mom when she hears baby clicking to support breast and adjust flange. Keeps cheeks to breast during feedings. Occasionally massage breast during feedings as well.  Encouraged to call for assistance or questions. Lactation brochure given.  Patient Name: Victoria Cowan [id number] Date: 07/15/2019 Reason for consult: Initial assessment;Primapara;Early term 37-38.6wks   Maternal Data Has patient been taught Hand Expression?: Yes Does the patient have breastfeeding experience prior to this delivery?: No  Feeding Feeding Type: Breast Fed  LATCH Score Latch: Grasps breast easily, tongue down, lips flanged, rhythmical sucking.  Audible Swallowing: None  Type of Nipple: Everted at rest and after stimulation  Comfort (Breast/Nipple): Soft / non-tender  Hold (Positioning): Assistance needed to correctly position infant at breast and maintain latch.  LATCH Score: 7  Interventions Interventions: Breast feeding basics reviewed;Support pillows;Assisted with latch;Position options;Skin to skin;Breast massage;Hand express;Breast compression;Adjust position  Lactation Tools  Discussed/Used WIC Program: Yes   Consult Status Consult Status: Follow-up Date: 07/16/19 Follow-up type: In-patient    [name], [name] 07/15/2019, 4:30 AM

## 2019-07-16 NOTE — Lactation Note (Signed)
This note was copied from a baby's chart. Lactation Consultation Note  Patient Name: Victoria Cowan [id number] Date: 07/16/2019 Reason for consult: Follow-up assessment;Difficult latch;Primapara;1st time breastfeeding;Early term 37-38.6wks  LC in to visit with P1 Mom of ET infant at 37 hrs old.  Baby at 5.3% weight loss.  Mom fed baby formula by bottle all night due to difficulty latching baby to the breast.    Baby loosely swaddled in crib, and starting to stir.  Offered to assist with positioning and latching and Mom agreeable.  Mom pre-pumped using hand pump.  Nipples erect but short shafted, areola very compressible.  Baby positioned in football hold and baby arched away from breast.  Switched to cross cradle hold, educating Mom on proper hand placement to support her breast and baby's head.  Repeatedly tried having baby suck on LC's finger first.  Baby starts clamping down, sucking on her tongue and lips.  Tried in laid back position with baby prone.  Baby would not open her mouth.  Baby's mouth so tight on digital assessment, unable to assess for tongue restriction.   Reassured Mom that this doesn't mean baby would never latch to breast.  Recommended she follow-up with OP lactation appointment.  Mom very interested.  Message sent to [facility].  Also talked about importance of regular double pumping.  Assisted Mom with double pumping using 24 mm flanges.  Mom does not have a pump at home, but is signed up with [facility].  [facility] referral faxed, and offered a [facility] loaner pump before discharge.    Plan- 1- Keep baby STS as much as possible 2- Offer breast with cues. 3- If no latch attained, Mom will double pumping for 15-30 mins 4- supplement with paced bottle per volume guidelines. 5- follow-up with OP lactation appointment. 6- call prn for concerns.  Mom aware of OP lactation support available to her.   Feeding Feeding Type: Breast Fed Nipple Type: Extra Slow Flow  LATCH Score Latch: Too  sleepy or reluctant, no latch achieved, no sucking elicited.  Audible Swallowing: None  Type of Nipple: Everted at rest and after stimulation(short nipple shaft, compressible areola)  Comfort (Breast/Nipple): Soft / non-tender  Hold (Positioning): Full assist, staff holds infant at breast  LATCH Score: 4  Interventions Interventions: Breast feeding basics reviewed;Assisted with latch;Skin to skin;Breast massage;Hand express;Pre-pump if needed;Breast compression;Adjust position;Support pillows;Position options;Expressed milk;DEBP;Hand pump  Lactation Tools Discussed/Used Tools: Pump;Nipple Dorris Carnes;Bottle Nipple shield size: 20 Breast pump type: Double-Electric Breast Pump;Manual WIC Program: Yes Pump Review: Setup, frequency, and cleaning;Milk Storage Initiated by:: Erby Pian RN IBCLC Date initiated:: 07/16/19   Consult Status Consult Status: Complete Date: 07/16/19 Follow-up type: Out-patient    Judee Clara 07/16/2019, 10:03 AM

## 2019-07-16 NOTE — Discharge Summary (Signed)
OB Discharge Summary     Patient Name: Victoria Cowan DOB: 1995/06/05 MRN: [id number]  Date of admission: 07/14/2019 Delivering MD: Noralyn Pick   Date of discharge: 07/16/2019  Admitting diagnosis: Normal labor [O80, Z37.9] Intrauterine pregnancy: [redacted]w[redacted]d     Secondary diagnosis:  Active Problems:   Normal labor  Additional problems: None     Discharge diagnosis: Term Pregnancy Delivered                                                                                                Post partum procedures:None  Augmentation: Pitocin  Complications: None  Hospital course:  Onset of Labor With Vaginal Delivery     24 y.o. yo G1P1001 at [redacted]w[redacted]d was admitted in Latent Labor on 07/14/2019. Patient had an uncomplicated labor course as follows:  Membrane Rupture Time/Date: 8:55 AM ,07/14/2019   Intrapartum Procedures: Episiotomy: None [1]                                         Lacerations:  None [1]  Patient had a delivery of a Viable infant. 07/14/2019  Information for the patient's newborn:  Amare, Kontos Girl Maranda [[id number]]  Delivery Method: Vaginal, Spontaneous(Filed from Delivery Summary)     Pateint had an uncomplicated postpartum course.  She is ambulating, tolerating a regular diet, passing flatus, and urinating well. Patient is discharged home in stable condition on 07/16/19.   Physical exam  Vitals:   07/15/19 0830 07/15/19 1441 07/15/19 2122 07/16/19 0538  BP: 116/77 125/77 122/80 115/64  Pulse: 86 78 86 74  Resp: 16 18 16 16   Temp: 97.7 F (36.5 C) 98.4 F (36.9 C) 98.9 F (37.2 C) 97.7 F (36.5 C)  TempSrc: Oral Oral Oral Oral  SpO2:  100% 99% 99%  Weight:      Height:       General: alert, cooperative and no distress Lochia: appropriate Uterine Fundus: firm Incision: N/A DVT Evaluation: No evidence of DVT seen on physical exam. Labs: Lab Results  Component Value Date   WBC 18.5 (H) 07/15/2019   HGB 14.3 07/15/2019   HCT 43.2 07/15/2019   MCV 93.7  07/15/2019   PLT 283 07/15/2019   CMP 12/22/2006  Glucose 109(H)  BUN 11  Creatinine 0.55  Sodium 137  Potassium 3.6  Chloride 102  CO2 24  Calcium 9.7  Total Protein 7.2  Total Bilirubin 0.8  Alkaline Phos 268  AST 27  ALT 18    Discharge instruction: per After Visit Summary and "Baby and Me Booklet".  After visit meds:  Allergies as of 07/16/2019      Reactions   Grapefruit Extract Itching   Guanfacine Hcl Hives      Medication List    STOP taking these medications   acetaminophen 325 MG tablet Commonly known as: TYLENOL     TAKE these medications   multivitamin-prenatal 27-0.8 MG Tabs tablet Take 1 tablet by mouth daily at 12 noon.  Diet: routine diet  Activity: Advance as tolerated. Pelvic rest for 6 weeks.   Outpatient follow up:6 weeks Follow up Appt:No future appointments. Follow up Visit:No follow-ups on file.  Postpartum contraception: Undecided  Newborn Data: Live born female  Birth Weight: 6 lb 3.7 oz (2825 g) APGAR: 9, 9  Newborn Delivery   Birth date/time: 07/14/2019 20:29:00 Delivery type: Vaginal, Spontaneous      Baby Feeding: Bottle and Breast Disposition:home with mother   07/16/2019 Kenney Houseman, CNM

## 2019-08-24 DIAGNOSIS — Z3009 Encounter for other general counseling and advice on contraception: Secondary | ICD-10-CM | POA: Diagnosis not present

## 2019-10-30 DIAGNOSIS — Z20822 Contact with and (suspected) exposure to covid-19: Secondary | ICD-10-CM | POA: Diagnosis not present

## 2019-10-30 DIAGNOSIS — Z1152 Encounter for screening for COVID-19: Secondary | ICD-10-CM | POA: Diagnosis not present

## 2019-10-30 DIAGNOSIS — J069 Acute upper respiratory infection, unspecified: Secondary | ICD-10-CM | POA: Diagnosis not present

## 2020-02-21 DIAGNOSIS — Z113 Encounter for screening for infections with a predominantly sexual mode of transmission: Secondary | ICD-10-CM | POA: Diagnosis not present

## 2020-02-21 DIAGNOSIS — N9489 Other specified conditions associated with female genital organs and menstrual cycle: Secondary | ICD-10-CM | POA: Diagnosis not present

## 2020-02-21 DIAGNOSIS — N898 Other specified noninflammatory disorders of vagina: Secondary | ICD-10-CM | POA: Diagnosis not present

## 2020-02-26 DIAGNOSIS — Z124 Encounter for screening for malignant neoplasm of cervix: Secondary | ICD-10-CM | POA: Diagnosis not present

## 2020-02-26 DIAGNOSIS — Z6820 Body mass index (BMI) 20.0-20.9, adult: Secondary | ICD-10-CM | POA: Diagnosis not present

## 2020-02-26 DIAGNOSIS — A630 Anogenital (venereal) warts: Secondary | ICD-10-CM | POA: Diagnosis not present

## 2020-02-26 DIAGNOSIS — Z0000 Encounter for general adult medical examination without abnormal findings: Secondary | ICD-10-CM | POA: Diagnosis not present

## 2020-02-26 DIAGNOSIS — Z01411 Encounter for gynecological examination (general) (routine) with abnormal findings: Secondary | ICD-10-CM | POA: Diagnosis not present

## 2020-03-16 ENCOUNTER — Ambulatory Visit: Payer: Medicaid Other

## 2020-03-26 DIAGNOSIS — Z20822 Contact with and (suspected) exposure to covid-19: Secondary | ICD-10-CM | POA: Diagnosis not present

## 2020-07-17 IMAGING — US US MFM OB FOLLOW-UP
1 series · 14 of 28 positions shown · non-contrast
Comparison: none

[Series 1: us mfm ob follow-up · 38 acquisitions, 14 frames shown]
[im 2/38]
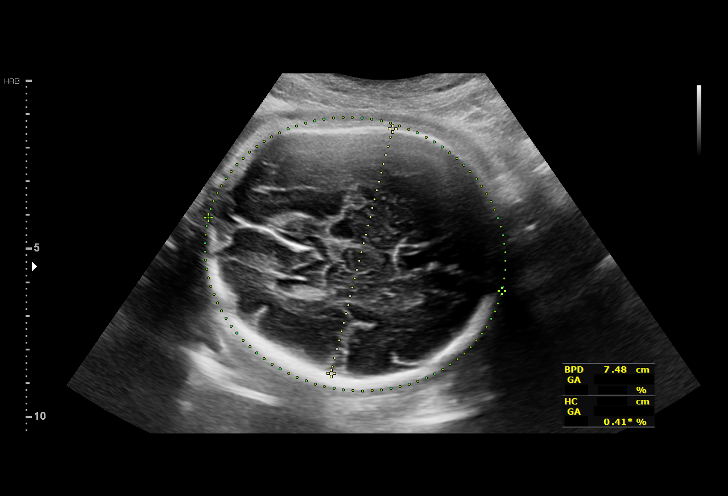
[im 5/38]
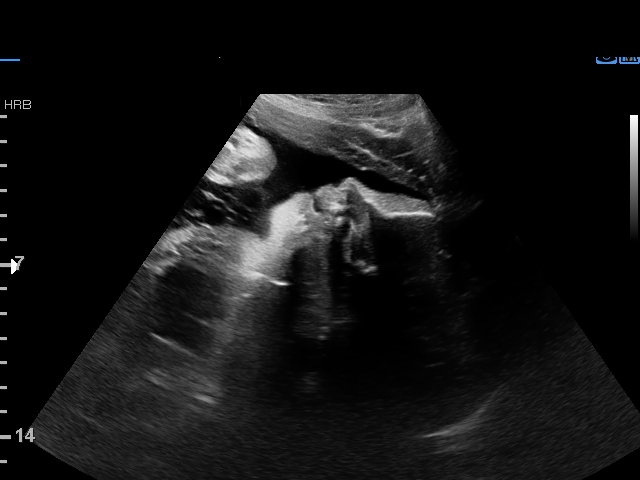
[im 7/38]
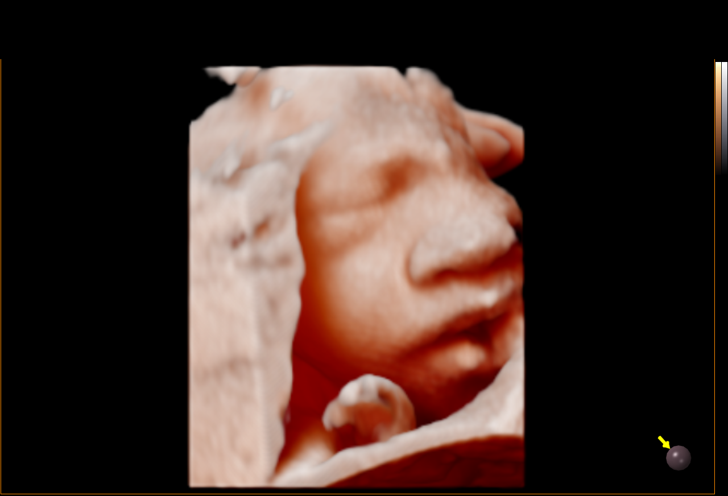
[im 10/38]
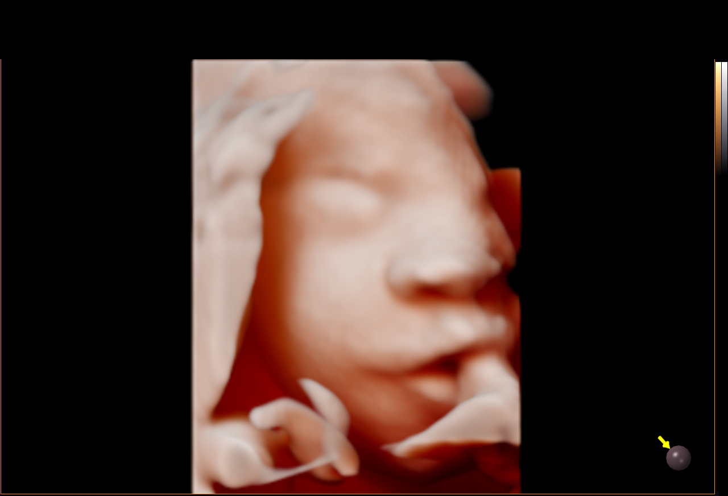
[im 13/38]
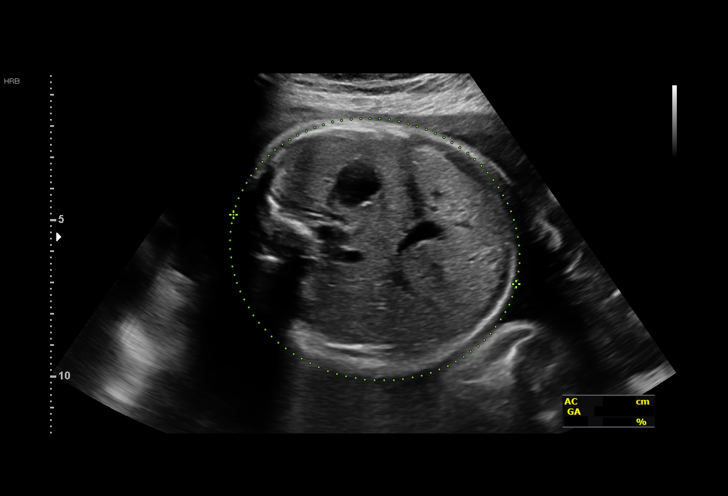
[im 16/38]
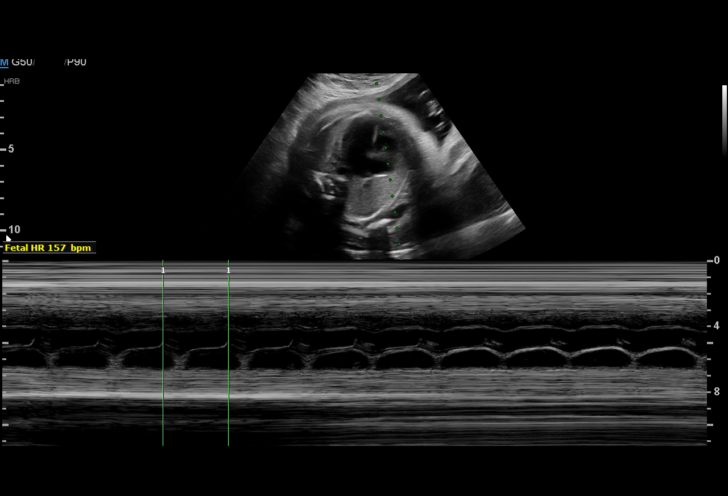
[im 18/38]
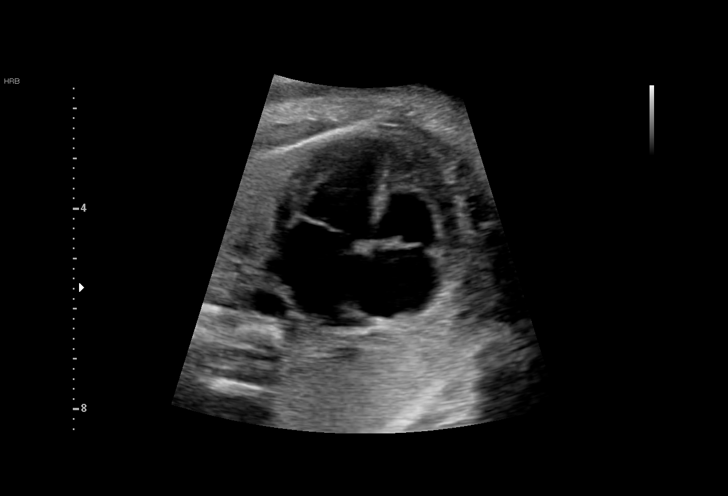
[im 21/38]
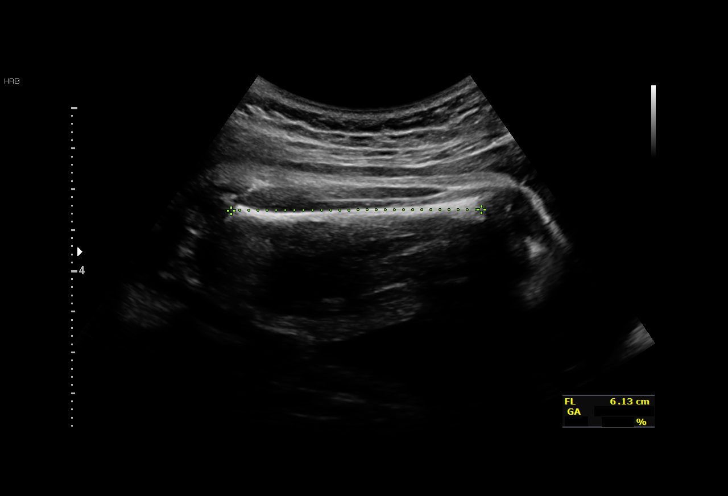
[im 24/38]
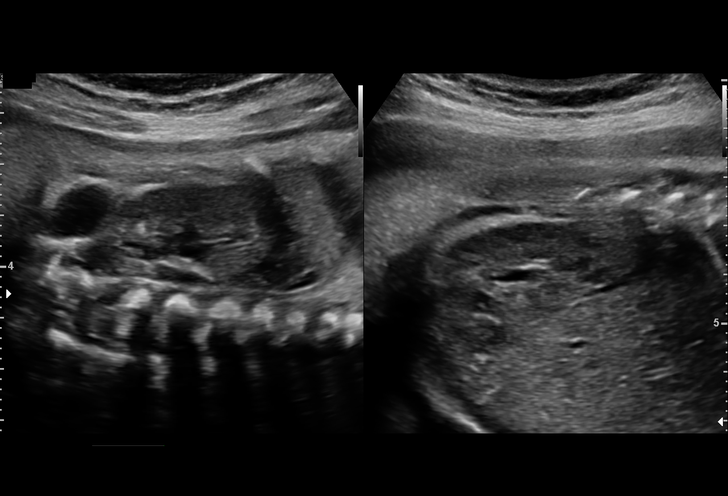
[im 27/38]
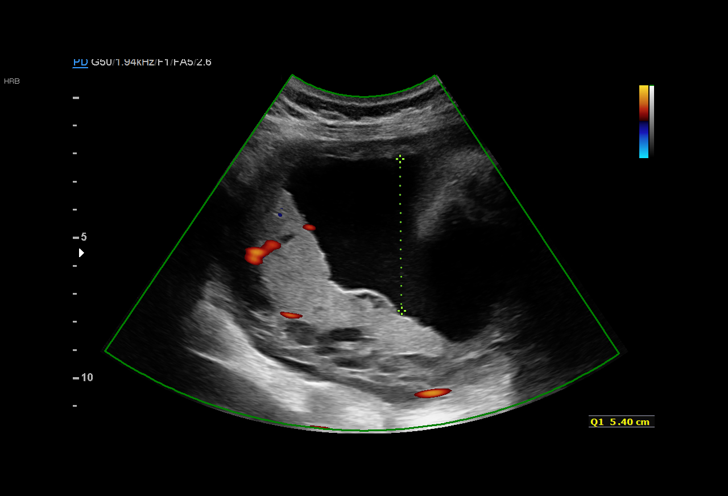
[im 29/38]
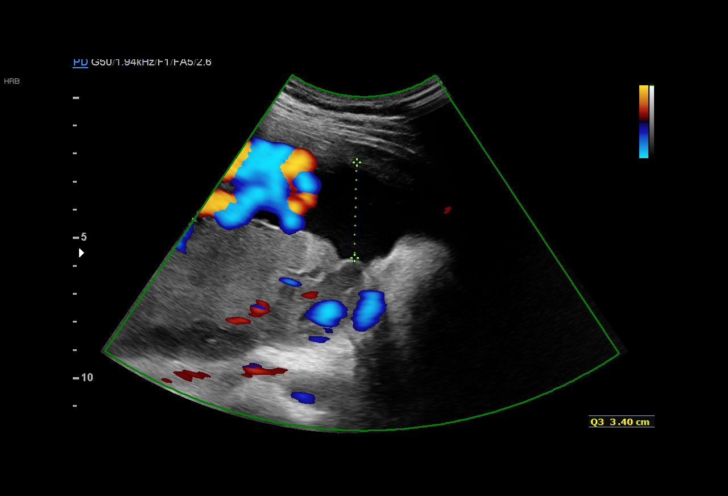
[im 32/38]
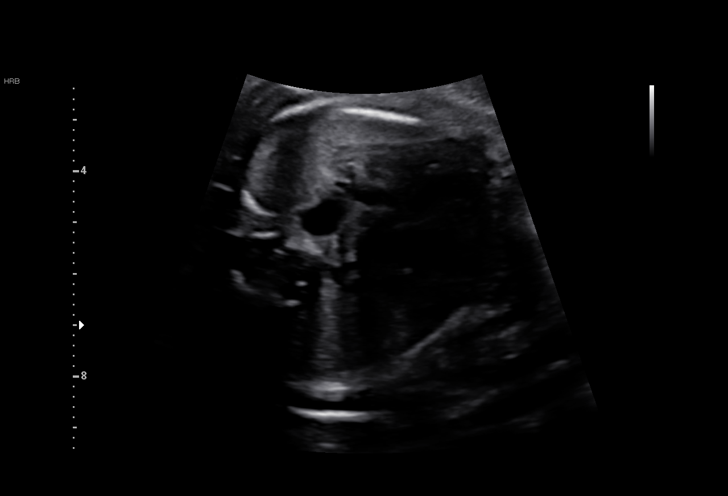
[im 35/38]
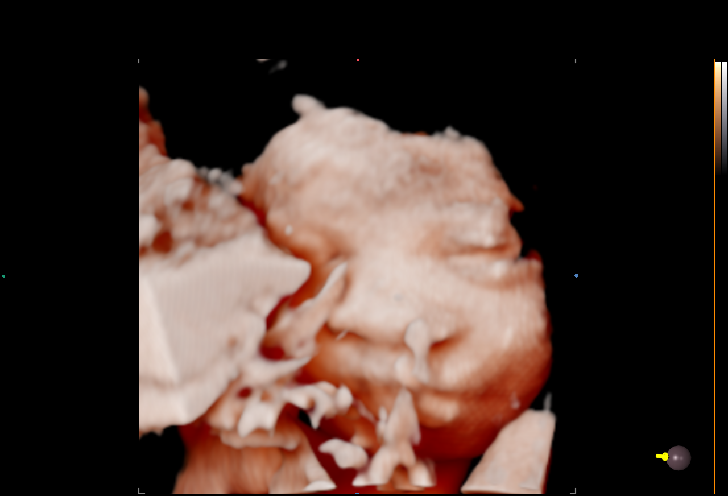
[im 38/38]
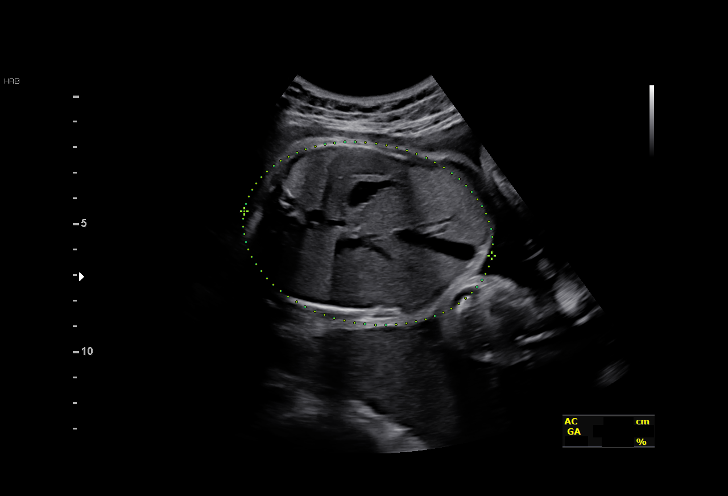

[14 of 28 positions shown; findings below may reference images not displayed]

Obstetrics &
                                                            Gynecology
                                                            [address]
                                                            [place].
                   CNM

 ----------------------------------------------------------------------

 ----------------------------------------------------------------------
Indications

  Size of fetus inconsistent with dates in third
  trimester
  Echogenic intracardiac focus of the heart
  (EIF)
  Encounter for cervical length
  Seizure disorder (no meds)
  31 weeks gestation of pregnancy
 ----------------------------------------------------------------------
Fetal Evaluation

 Num Of Fetuses:         1
 Fetal Heart Rate(bpm):  157
 Cardiac Activity:       Observed
 Presentation:           Cephalic
 Placenta:               Posterior
 P. Cord Insertion:      Previously Visualized

 Amniotic Fluid
 AFI FV:      Within normal limits

 AFI Sum(cm)     %Tile       Largest Pocket(cm)
 17.87           67

 RUQ(cm)       RLQ(cm)       LUQ(cm)        LLQ(cm)

Biometry

 BPD:      76.2  mm     G. Age:  30w 4d         23  %    CI:        80.31   %    70 - 86
                                                         FL/HC:      22.8   %    19.3 -
 HC:      268.6  mm     G. Age:  29w 2d        < 1  %    HC/AC:      0.97        0.96 -
 AC:      278.2  mm     G. Age:  31w 6d         69  %    FL/BPD:     80.4   %    71 - 87
 FL:       61.3  mm     G. Age:  31w 6d         55  %    FL/AC:      22.0   %    20 - 24

 Est. FW:    0779  gm    3 lb 15 oz      49  %
OB History

 Gravidity:    1         Term:   0
 Living:       0
Gestational Age

 LMP:           29w 5d        Date:  11/02/18                 EDD:   08/09/19
 U/S Today:     30w 6d                                        EDD:   08/01/19
 Best:          31w 1d     Det. By:  U/S  (03/23/19)          EDD:   07/30/19
Anatomy

 Cranium:               Appears normal         Aortic Arch:            Previously seen
 Cavum:                 Appears normal         Ductal Arch:            Previously seen
 Ventricles:            Appears normal         Diaphragm:              Appears normal
 Choroid Plexus:        Previously seen        Stomach:                Appears normal, left
                                                                       sided
 Cerebellum:            Previously seen        Abdomen:                Appears normal
 Posterior Fossa:       Previously seen        Abdominal Wall:         Previously seen
 Nuchal Fold:           Not applicable (>20    Cord Vessels:           Previously seen
                        wks GA)
 Face:                  Orbits and profile     Kidneys:                Appear normal
                        previously seen
 Lips:                  Previously seen        Bladder:                Appears normal
 Thoracic:              Appears normal         Spine:                  Previously seen
 Heart:                 Echogenic focus        Upper Extremities:      Previously seen
                        in LV prev seen
 RVOT:                  Previously seen        Lower Extremities:      Previously seen
 LVOT:                  Previously seen
Cervix Uterus Adnexa

 Cervix
 Not visualized (advanced GA >06wks)
Comments

 This patient was seen for a follow up growth scan as her
 fundal heights have been measuring less than her dates.
 She denies any other problems in her current pregnancy.
 She was informed that the fetal growth and amniotic fluid
 level appears appropriate for her gestational age.
 Follow-up as indicated.

## 2021-03-13 ENCOUNTER — Other Ambulatory Visit: Payer: Self-pay

## 2021-03-13 ENCOUNTER — Telehealth (INDEPENDENT_AMBULATORY_CARE_PROVIDER_SITE_OTHER): Payer: Medicaid Other | Admitting: Family Medicine

## 2021-03-13 ENCOUNTER — Encounter: Payer: Self-pay | Admitting: Family Medicine

## 2021-03-13 DIAGNOSIS — R6889 Other general symptoms and signs: Secondary | ICD-10-CM | POA: Diagnosis not present

## 2021-03-13 NOTE — Patient Instructions (Addendum)
° ° ° °--------------------------------------------------------------------------------------------------------------------------- ° ° ° °  WORK SLIP:  Patient Victoria Cowan,  February 09, 1996, was seen for a medical visit today, 03/13/21 . Please excuse from work for a COVID/flu like illness. If Covid19 testing is positive advise 10 days of isolation from onset of symptoms as most patients are likely contagious for 7-10 days. Onset of symptoms (03/11/21)  Will defer to employer for a sooner return to work if patient has 2 negative covid tests 24-48 hours apart and is feeling better, or if symptoms have resolved, it is greater than 5 days since the positive test and the patient can wear a high-quality, tight fitting mask such as N95 or KN95 at all times for an additional 5 days.   Sincerely: E-signature: Dr. Kriste Basque, DO [facility] [place] Ph: [phone]   ------------------------------------------------------------------------------------------------------------------------------      HOME CARE TIPS:  -COVID19 testing information: [email or web address]  Most pharmacies also offer testing and home test kits.    -can use tylenol or aleve if needed for fevers, aches and pains per instructions  -imodium if needed if any further diarrhea  -can use nasal saline a few times per day if you have nasal congestion; sometimes  a short course of Afrin nasal spray for 3 days can help with symptoms as well  -stay hydrated, drink plenty of fluids and eat small healthy meals - avoid dairy  -can take 1000 IU ( ) Vit D3 and 100-500 mg of Vit C daily per instructions  -If the Covid test is positive, check out the [facility] website for more information on home care, transmission and treatment for COVID19  -follow up with your doctor in 2-3 days unless improving and feeling better  -stay home while sick, except to seek medical care. If you have COVID19, you will likely be  contagious for 7-10 days. Flu or Influenza is likely contagious for about 7 days. Other respiratory viral infections remain contagious for 5-10+ days depending on the virus and many other factors. Wear a good mask that fits snugly (such as N95 or KN95) if around others to reduce the risk of transmission.  It was nice to meet you today, and I really hope you are feeling better soon. I help [facility] out with telemedicine visits on Tuesdays and Thursdays and am happy to help if you need a follow up virtual visit on those days. Otherwise, if you have any concerns or questions following this visit please schedule a follow up visit with your Primary Care doctor or seek care at a local urgent care clinic to avoid delays in care.    Seek in person care or schedule a follow up video visit promptly if your symptoms worsen, new concerns arise or you are not improving with treatment. Call 911 and/or seek emergency care if your symptoms are severe or life threatening.

## 2021-03-13 NOTE — Progress Notes (Signed)
Virtual Visit via Video Note  I connected with Victoria Cowan  on 03/13/21 at  1:20 PM EST by a video enabled telemedicine application and verified that I am speaking with the correct person using two identifiers.  Location patient: [place] Location provider:work or home office Persons participating in the virtual visit: patient, provider  I discussed the limitations and requested verbal permission for telemedicine visit. The patient expressed understanding and agreed to proceed.   HPI:  Acute telemedicine visit for sinus issues -Symptoms include: 1-2 days ago -Denies:sinus issues, stomachache, diarrhea, subjective fever -feels like is improving and doing much better today -Has tried:CP, SOB, Vomiting, inability to tol oral intake -Pertinent past medical history: see below -Pertinent medication allergies: Allergies  Allergen Reactions   Grapefruit Extract Itching   Guanfacine Hcl Hives  -COVID-19 vaccine status:  Immunization History  Administered Date(s) Administered   PFIZER(Purple Top)SARS-COV-2 Vaccination 03/30/2020, 04/25/2020   Tdap 07/16/2019     ROS: See pertinent positives and negatives per HPI.  Past Medical History:  Diagnosis Date   Seizures (HCC)    2014 last seizures    Past Surgical History:  Procedure Laterality Date   ADENOIDECTOMY AND MYRINGOTOMY WITH TUBE PLACEMENT     26 Years old   TYMPANOSTOMY TUBE PLACEMENT Bilateral    26 Years old    No current outpatient medications on file.  EXAM:  VITALS per patient if applicable:  GENERAL: alert, oriented, appears well and in no acute distress  HEENT: atraumatic, conjunttiva clear, no obvious abnormalities on inspection of external nose and ears  NECK: normal movements of the head and neck  LUNGS: on inspection no signs of respiratory distress, breathing rate appears normal, no obvious gross SOB, gasping or wheezing  CV: no obvious cyanosis  MS: moves all visible extremities without noticeable  abnormality  PSYCH/NEURO: pleasant and cooperative, no obvious depression or anxiety, speech and thought processing grossly intact  ASSESSMENT AND PLAN:  Discussed the following assessment and plan:  Flu-like symptoms  -we discussed possible serious and likely etiologies, options for evaluation and workup, limitations of telemedicine visit vs in person visit, treatment, treatment risks and precautions. Pt is agreeable to treatment via telemedicine at this moment. Query influenza, [phone], other viral resp illness vs other. She reports she is already doing much better. Opted for symptomatic care per pt instructions and covid testing.  Work/School slipped offered: provided in patient instructions  Advised to seek prompt virtual visit or in person care if worsening, new symptoms arise, or if is not improving with treatment as expected per our conversation of expected course. Discussed options for follow up care. Did let this patient know that I do telemedicine on Tuesdays and Thursdays for [place] and those are the days I am logged into the system. Advised to schedule follow up visit with PCP, [place] virtual visits or UCC if any further questions or concerns to avoid delays in care.   I discussed the assessment and treatment plan with the patient. The patient was provided an opportunity to ask questions and all were answered. The patient agreed with the plan and demonstrated an understanding of the instructions.     Victoria Koyanagi, DO

## 2021-03-14 ENCOUNTER — Telehealth: Payer: Medicaid Other | Admitting: Family Medicine

## 2021-03-26 DIAGNOSIS — Z6820 Body mass index (BMI) 20.0-20.9, adult: Secondary | ICD-10-CM | POA: Diagnosis not present

## 2021-03-26 DIAGNOSIS — Z3009 Encounter for other general counseling and advice on contraception: Secondary | ICD-10-CM | POA: Diagnosis not present

## 2021-03-26 DIAGNOSIS — Z113 Encounter for screening for infections with a predominantly sexual mode of transmission: Secondary | ICD-10-CM | POA: Diagnosis not present

## 2021-03-26 DIAGNOSIS — Z0000 Encounter for general adult medical examination without abnormal findings: Secondary | ICD-10-CM | POA: Diagnosis not present

## 2021-03-26 DIAGNOSIS — Z0001 Encounter for general adult medical examination with abnormal findings: Secondary | ICD-10-CM | POA: Diagnosis not present

## 2021-03-26 DIAGNOSIS — R87612 Low grade squamous intraepithelial lesion on cytologic smear of cervix (LGSIL): Secondary | ICD-10-CM | POA: Diagnosis not present

## 2021-03-26 DIAGNOSIS — Z124 Encounter for screening for malignant neoplasm of cervix: Secondary | ICD-10-CM | POA: Diagnosis not present

## 2021-05-12 DIAGNOSIS — R87612 Low grade squamous intraepithelial lesion on cytologic smear of cervix (LGSIL): Secondary | ICD-10-CM | POA: Diagnosis not present

## 2021-05-12 DIAGNOSIS — Z23 Encounter for immunization: Secondary | ICD-10-CM | POA: Diagnosis not present

## 2021-05-12 DIAGNOSIS — R87810 Cervical high risk human papillomavirus (HPV) DNA test positive: Secondary | ICD-10-CM | POA: Diagnosis not present

## 2021-05-19 ENCOUNTER — Encounter: Payer: Self-pay | Admitting: Family Medicine

## 2021-07-11 DIAGNOSIS — Z23 Encounter for immunization: Secondary | ICD-10-CM | POA: Diagnosis not present

## 2021-11-13 DIAGNOSIS — Z23 Encounter for immunization: Secondary | ICD-10-CM | POA: Diagnosis not present

## 2022-07-16 ENCOUNTER — Telehealth: Payer: Self-pay

## 2022-07-16 NOTE — Telephone Encounter (Signed)
LVM for pt to call back. AS, CMA

## 2022-10-24 DIAGNOSIS — R9431 Abnormal electrocardiogram [ECG] [EKG]: Secondary | ICD-10-CM | POA: Diagnosis not present

## 2022-10-24 DIAGNOSIS — R0789 Other chest pain: Secondary | ICD-10-CM | POA: Diagnosis not present

## 2022-10-24 DIAGNOSIS — R000 Tachycardia, unspecified: Secondary | ICD-10-CM | POA: Diagnosis not present

## 2022-10-24 DIAGNOSIS — R079 Chest pain, unspecified: Secondary | ICD-10-CM | POA: Diagnosis not present

## 2022-10-24 DIAGNOSIS — R002 Palpitations: Secondary | ICD-10-CM | POA: Diagnosis not present

## 2023-01-20 ENCOUNTER — Ambulatory Visit: Payer: Medicaid Other | Admitting: Family Medicine
